# Patient Record
Sex: Male | Born: 1970 | Race: White | Hispanic: No | Marital: Single | Smoking: Never smoker
Health system: Southern US, Community
[De-identification: ages and names within clinical notes are randomized; demographics above are authoritative.]

---

## 2007-05-16 ENCOUNTER — Ambulatory Visit: Payer: Self-pay | Admitting: Family Medicine

## 2007-07-13 ENCOUNTER — Ambulatory Visit: Payer: Self-pay | Admitting: Family Medicine

## 2008-12-07 ENCOUNTER — Ambulatory Visit: Payer: Self-pay | Admitting: Surgery

## 2009-02-14 ENCOUNTER — Ambulatory Visit: Payer: Self-pay | Admitting: Surgery

## 2009-07-27 ENCOUNTER — Ambulatory Visit: Payer: Self-pay | Admitting: Internal Medicine

## 2010-06-05 ENCOUNTER — Ambulatory Visit: Payer: Self-pay | Admitting: Internal Medicine

## 2011-01-30 ENCOUNTER — Ambulatory Visit: Payer: Self-pay

## 2011-03-12 IMAGING — US US PELVIS LIMITED
1 series · 17 of 25 positions shown · non-contrast
Comparison: none

REASON FOR EXAM: RT inguinal hernia repair 09/06/08   RT testical swelling
eval hydrocele
COMMENTS:

[Series 1: us pelvis limited · 17 of 39 slices shown]
[im 1/39]
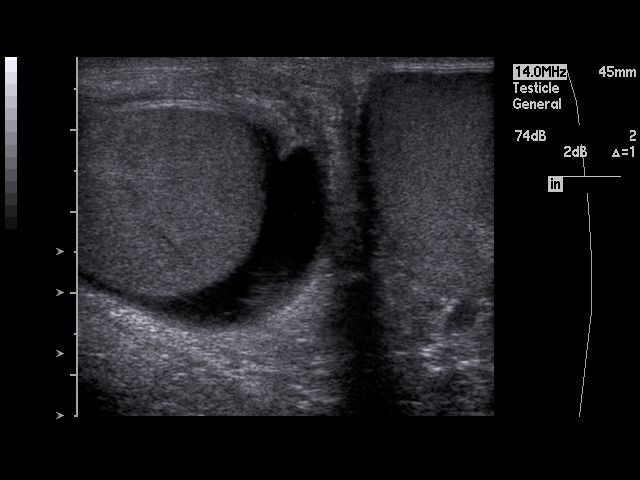
[im 4/39]
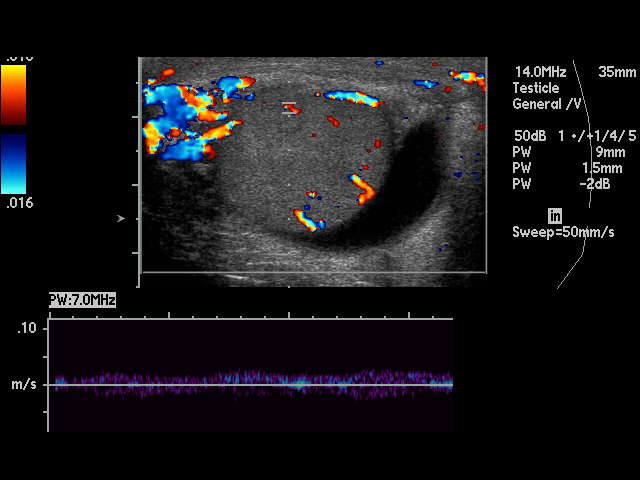
[im 5/39]
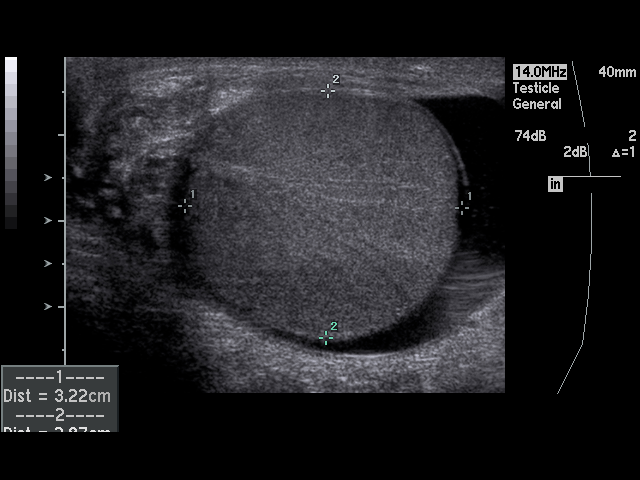
[im 8/39]
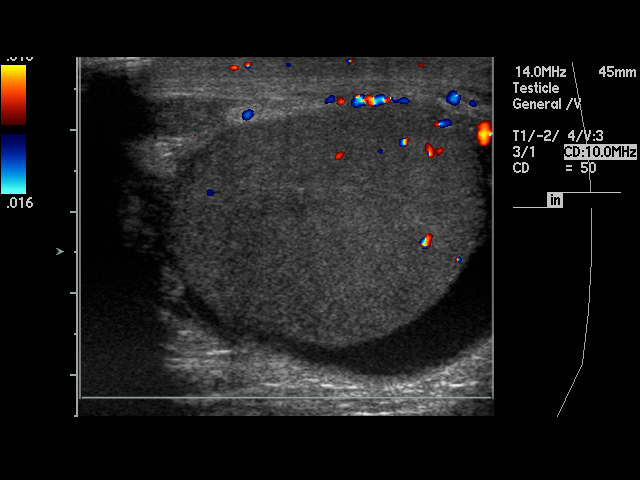
[im 10/39]
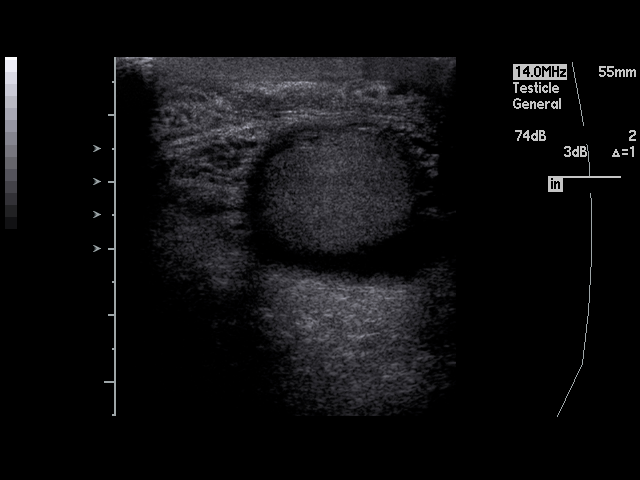
[im 13/39]
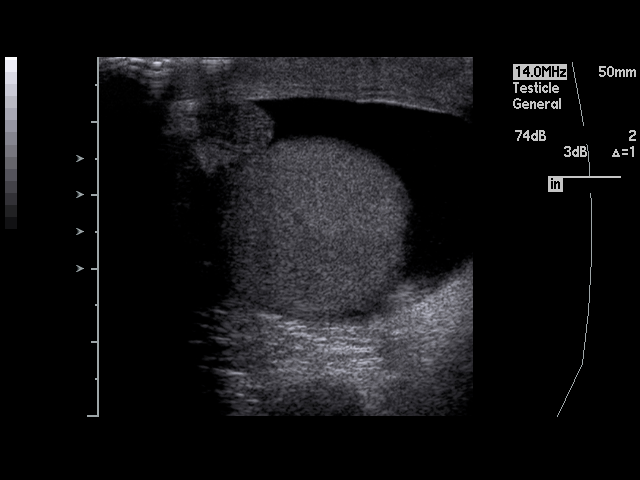
[im 15/39]
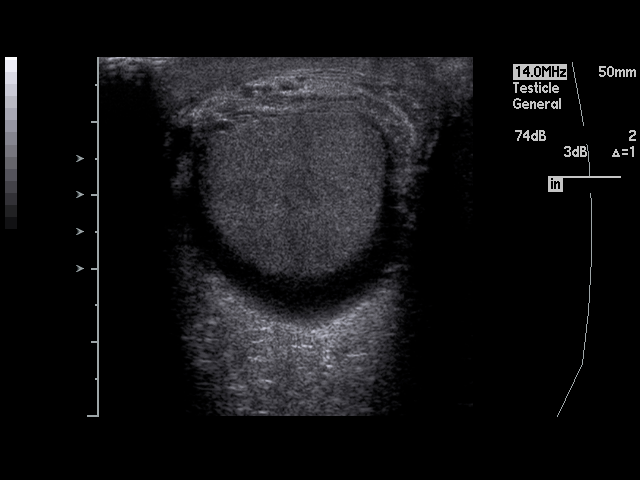
[im 18/39]
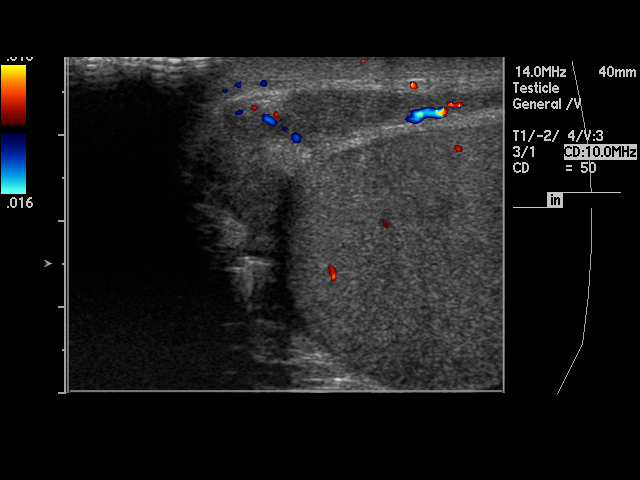
[im 20/39]
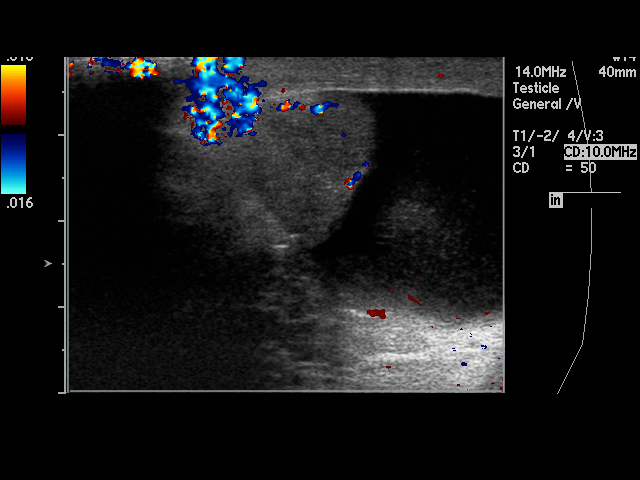
[im 21/39]
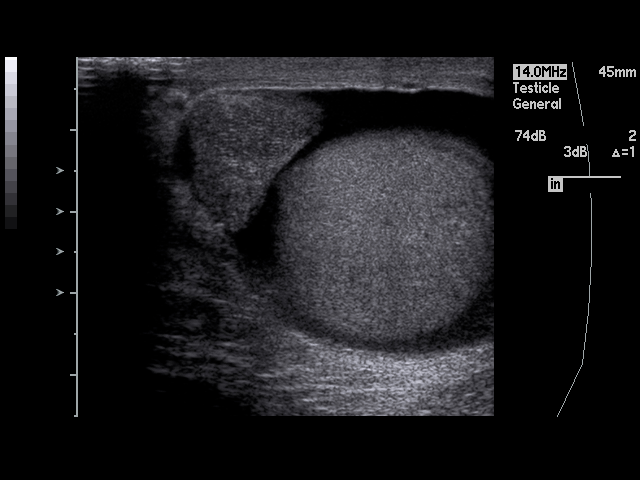
[im 24/39]
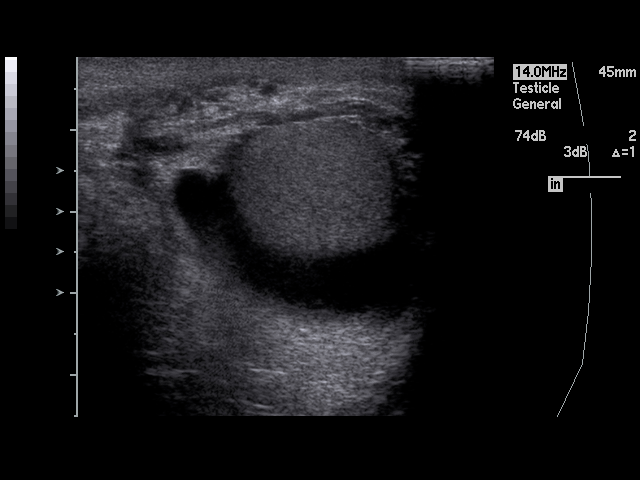
[im 26/39]
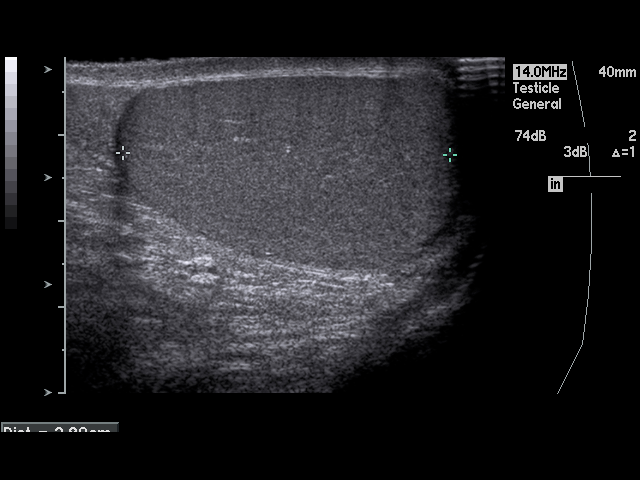
[im 29/39]
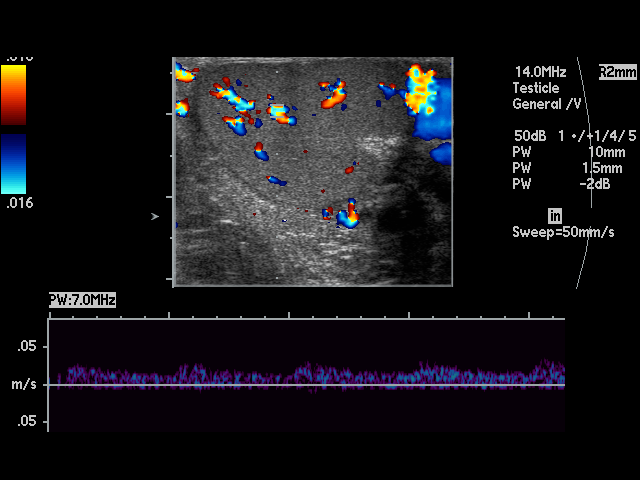
[im 31/39]
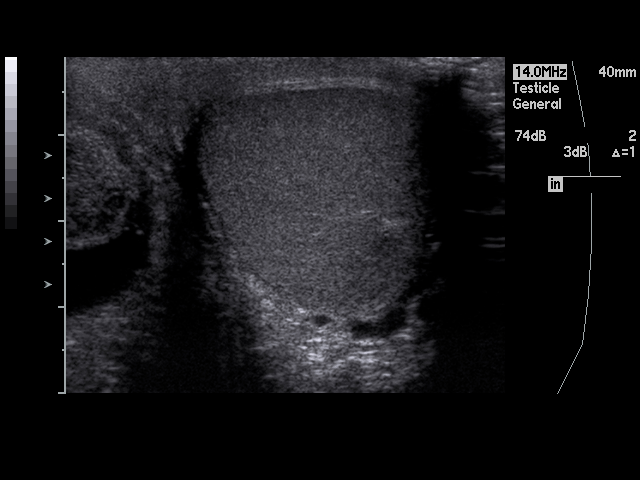
[im 34/39]
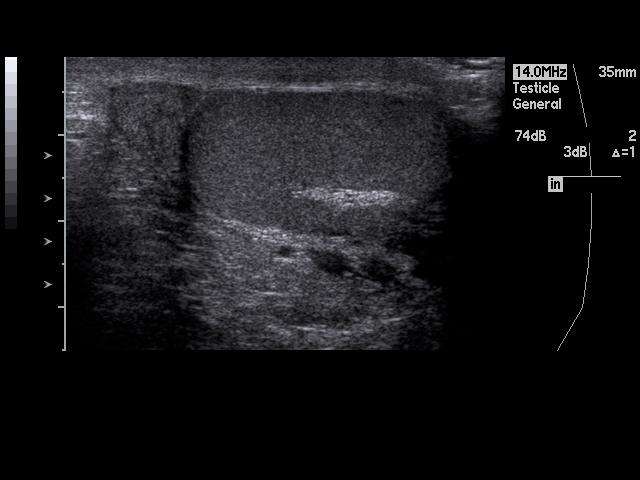
[im 35/39]
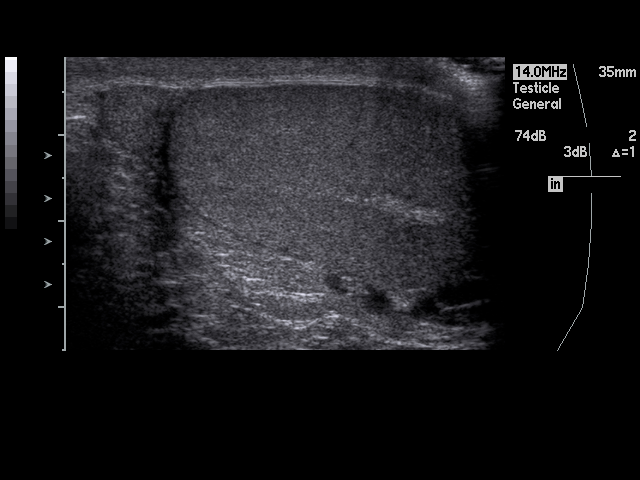
[im 39/39]
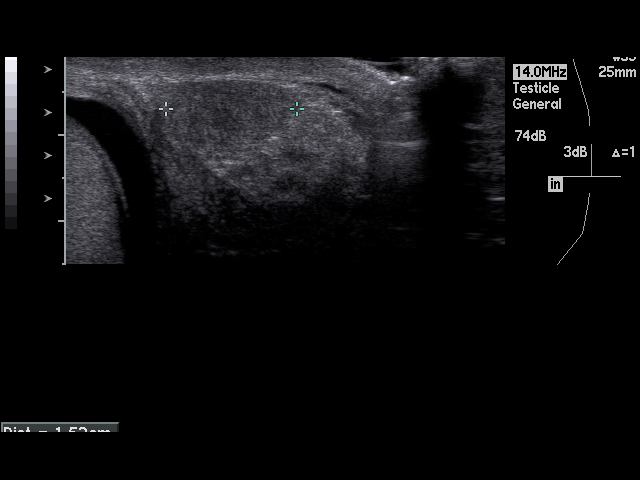

[17 of 25 positions shown; findings below may reference images not displayed]

PROCEDURE:     [facility] - [facility] TESTICULAR  - December 07, 2008 [DATE]

RESULT:     The testes demonstrate normal echotexture bilaterally. The right
testicle measures 3.7 x 3.2 x 2.9 cm. The left testicle measures 3.8 x 2.4 x
2.8 cm. Vascularity of the testes is normal. There is a hydrocele on the
right. The epididymal structures are relatively normal in appearance
although somewhat prominent in size. The right epididymal structure measures
maximum 3.9 cm and on the left it measures 3 cm in greatest dimension.
Vascularity appears normal.
IMPRESSION: 1. I do not see evidence of an intratesticular mass nor evidence of torsion
of the testicle.
2. There is mild prominence of the epididymal structures bilaterally without
objective evidence of acute epididymitis.
3. There is a hydrocele on the right.

## 2015-01-21 ENCOUNTER — Encounter: Payer: Self-pay | Admitting: Emergency Medicine

## 2015-01-21 ENCOUNTER — Ambulatory Visit
Admission: EM | Admit: 2015-01-21 | Discharge: 2015-01-21 | Disposition: A | Discharge status: Home / Self Care | Payer: Federal, State, Local not specified - PPO | Attending: Family Medicine | Admitting: Family Medicine

## 2015-01-21 DIAGNOSIS — J309 Allergic rhinitis, unspecified: Secondary | ICD-10-CM

## 2015-01-21 DIAGNOSIS — R030 Elevated blood-pressure reading, without diagnosis of hypertension: Secondary | ICD-10-CM

## 2015-01-21 DIAGNOSIS — H539 Unspecified visual disturbance: Secondary | ICD-10-CM

## 2015-01-21 DIAGNOSIS — H9313 Tinnitus, bilateral: Secondary | ICD-10-CM

## 2015-01-21 DIAGNOSIS — B001 Herpesviral vesicular dermatitis: Secondary | ICD-10-CM

## 2015-01-21 DIAGNOSIS — J0100 Acute maxillary sinusitis, unspecified: Secondary | ICD-10-CM | POA: Diagnosis not present

## 2015-01-21 DIAGNOSIS — H6593 Unspecified nonsuppurative otitis media, bilateral: Secondary | ICD-10-CM

## 2015-01-21 DIAGNOSIS — IMO0001 Reserved for inherently not codable concepts without codable children: Secondary | ICD-10-CM

## 2015-01-21 MED ORDER — MOMETASONE FUROATE 50 MCG/ACT NA SUSP: 2.0000 | Freq: Every day | NASAL | Status: AC

## 2015-01-21 MED ORDER — CETIRIZINE HCL 10 MG PO TBDP: 10.0000 mg | ORAL_TABLET | Freq: Every day | ORAL | Status: AC

## 2015-01-21 MED ORDER — SALINE SPRAY 0.65 % NA SOLN: 2.0000 | NASAL | Status: AC

## 2015-01-21 NOTE — Discharge Instructions (Signed)
Allergic Rhinitis °Allergic rhinitis is when the mucous membranes in the nose respond to allergens. Allergens are particles in the air that cause your body to have an allergic reaction. This causes you to release allergic antibodies. Through a chain of events, these eventually cause you to release histamine into the blood stream. Although meant to protect the body, it is this release of histamine that causes your discomfort, such as frequent sneezing, congestion, and an itchy, runny nose.  °CAUSES °Seasonal allergic rhinitis (hay fever) is caused by pollen allergens that may come from grasses, trees, and weeds. Year-round allergic rhinitis (perennial allergic rhinitis) is caused by allergens such as house dust mites, pet dander, and mold spores. °SYMPTOMS °· Nasal stuffiness (congestion). °· Itchy, runny nose with sneezing and tearing of the eyes. °DIAGNOSIS °Your health care provider can help you determine the allergen or allergens that trigger your symptoms. If you and your health care provider are unable to determine the allergen, skin or blood testing may be used. Your health care provider will diagnose your condition after taking your health history and performing a physical exam. Your health care provider may assess you for other related conditions, such as asthma, pink eye, or an ear infection. °TREATMENT °Allergic rhinitis does not have a cure, but it can be controlled by: °· Medicines that block allergy symptoms. These may include allergy shots, nasal sprays, and oral antihistamines. °· Avoiding the allergen. °Hay fever may often be treated with antihistamines in pill or nasal spray forms. Antihistamines block the effects of histamine. There are over-the-counter medicines that may help with nasal congestion and swelling around the eyes. Check with your health care provider before taking or giving this medicine. °If avoiding the allergen or the medicine prescribed do not work, there are many new medicines  your health care provider can prescribe. Stronger medicine may be used if initial measures are ineffective. Desensitizing injections can be used if medicine and avoidance does not work. Desensitization is when a patient is given ongoing shots until the body becomes less sensitive to the allergen. Make sure you follow up with your health care provider if problems continue. °HOME CARE INSTRUCTIONS °It is not possible to completely avoid allergens, but you can reduce your symptoms by taking steps to limit your exposure to them. It helps to know exactly what you are allergic to so that you can avoid your specific triggers. °SEEK MEDICAL CARE IF: °· You have a fever. °· You develop a cough that does not stop easily (persistent). °· You have shortness of breath. °· You start wheezing. °· Symptoms interfere with normal daily activities. °  °This information is not intended to replace advice given to you by your health care provider. Make sure you discuss any questions you have with your health care provider. °  °Document Released: 12/26/2000 Document Revised: 04/23/2014 Document Reviewed: 12/08/2012 °[employer] Interactive Patient Education ©2016 [employer] °Otitis Media With Effusion °Otitis media with effusion is the presence of fluid in the middle ear. This is a common problem in children, which often follows ear infections. It may be present for weeks or longer after the infection. Unlike an acute ear infection, otitis media with effusion refers only to fluid behind the ear drum and not infection. Children with repeated ear and sinus infections and allergy problems are the most likely to get otitis media with effusion. °CAUSES  °The most frequent cause of the fluid buildup is dysfunction of the eustachian tubes. These are the tubes that drain fluid   in the ears to the back of the nose (nasopharynx). °SYMPTOMS  °· The main symptom of this condition is hearing loss. As a result, you or your child may: °¨ Listen to the TV  at a loud volume. °¨ Not respond to questions. °¨ Ask "what" often when spoken to. °¨ Mistake or confuse one sound or word for another. °· There may be a sensation of fullness or pressure but usually not pain. °DIAGNOSIS  °· Your health care provider will diagnose this condition by examining you or your child's ears. °· Your health care provider may test the pressure in you or your child's ear with a tympanometer. °· A hearing test may be conducted if the problem persists. °TREATMENT  °· Treatment depends on the duration and the effects of the effusion. °· Antibiotics, decongestants, nose drops, and cortisone-type drugs (tablets or nasal spray) may not be helpful. °· Children with persistent ear effusions may have delayed language or behavioral problems. Children at risk for developmental delays in hearing, learning, and speech may require referral to a specialist earlier than children not at risk. °· You or your child's health care provider may suggest a referral to an ear, nose, and throat surgeon for treatment. The following may help restore normal hearing: °¨ Drainage of fluid. °¨ Placement of ear tubes (tympanostomy tubes). °¨ Removal of adenoids (adenoidectomy). °HOME CARE INSTRUCTIONS  °· Avoid secondhand smoke. °· Infants who are breastfed are less likely to have this condition. °· Avoid feeding infants while they are lying flat. °· Avoid known environmental allergens. °· Avoid people who are sick. °SEEK MEDICAL CARE IF:  °· Hearing is not better in 3 months. °· Hearing is worse. °· Ear pain. °· Drainage from the ear. °· Dizziness. °MAKE SURE YOU:  °· Understand these instructions. °· Will watch your condition. °· Will get help right away if you are not doing well or get worse. °  °This information is not intended to replace advice given to you by your health care provider. Make sure you discuss any questions you have with your health care provider. °  °Document Released: 05/10/2004 Document Revised:  04/23/2014 Document Reviewed: 10/28/2012 °[employer] Interactive Patient Education ©2016 [employer] °Sinusitis, Adult °Sinusitis is redness, soreness, and inflammation of the paranasal sinuses. Paranasal sinuses are air pockets within the bones of your face. They are located beneath your eyes, in the middle of your forehead, and above your eyes. In healthy paranasal sinuses, mucus is able to drain out, and air is able to circulate through them by way of your nose. However, when your paranasal sinuses are inflamed, mucus and air can become trapped. This can allow bacteria and other germs to grow and cause infection. °Sinusitis can develop quickly and last only a short time (acute) or continue over a long period (chronic). Sinusitis that lasts for more than 12 weeks is considered chronic. °CAUSES °Causes of sinusitis include: °· Allergies. °· Structural abnormalities, such as displacement of the cartilage that separates your nostrils (deviated septum), which can decrease the air flow through your nose and sinuses and affect sinus drainage. °· Functional abnormalities, such as when the small hairs (cilia) that line your sinuses and help remove mucus do not work properly or are not present. °SIGNS AND SYMPTOMS °Symptoms of acute and chronic sinusitis are the same. The primary symptoms are pain and pressure around the affected sinuses. Other symptoms include: °· Upper toothache. °· Earache. °· Headache. °· Bad breath. °· Decreased sense of smell and taste. °·   A cough, which worsens when you are lying flat. °· Fatigue. °· Fever. °· Thick drainage from your nose, which often is green and may contain pus (purulent). °· Swelling and warmth over the affected sinuses. °DIAGNOSIS °Your health care provider will perform a physical exam. During your exam, your health care provider may perform any of the following to help determine if you have acute sinusitis or chronic sinusitis: °· Look in your nose for signs of abnormal  growths in your nostrils (nasal polyps). °· Tap over the affected sinus to check for signs of infection. °· View the inside of your sinuses using an imaging device that has a light attached (endoscope). °If your health care provider suspects that you have chronic sinusitis, one or more of the following tests may be recommended: °· Allergy tests. °· Nasal culture. A sample of mucus is taken from your nose, sent to a lab, and screened for bacteria. °· Nasal cytology. A sample of mucus is taken from your nose and examined by your health care provider to determine if your sinusitis is related to an allergy. °TREATMENT °Most cases of acute sinusitis are related to a viral infection and will resolve on their own within 10 days. Sometimes, medicines are prescribed to help relieve symptoms of both acute and chronic sinusitis. These may include pain medicines, decongestants, nasal steroid sprays, or saline sprays. °However, for sinusitis related to a bacterial infection, your health care provider will prescribe antibiotic medicines. These are medicines that will help kill the bacteria causing the infection. °Rarely, sinusitis is caused by a fungal infection. In these cases, your health care provider will prescribe antifungal medicine. °For some cases of chronic sinusitis, surgery is needed. Generally, these are cases in which sinusitis recurs more than 3 times per year, despite other treatments. °HOME CARE INSTRUCTIONS °· Drink plenty of water. Water helps thin the mucus so your sinuses can drain more easily. °· Use a humidifier. °· Inhale steam 3-4 times a day (for example, sit in the bathroom with the shower running). °· Apply a warm, moist washcloth to your face 3-4 times a day, or as directed by your health care provider. °· Use saline nasal sprays to help moisten and clean your sinuses. °· Take medicines only as directed by your health care provider. °· If you were prescribed either an antibiotic or antifungal medicine,  finish it all even if you start to feel better. °SEEK IMMEDIATE MEDICAL CARE IF: °· You have increasing pain or severe headaches. °· You have nausea, vomiting, or drowsiness. °· You have swelling around your face. °· You have vision problems. °· You have a stiff neck. °· You have difficulty breathing. °  °This information is not intended to replace advice given to you by your health care provider. Make sure you discuss any questions you have with your health care provider. °  °Document Released: 04/02/2005 Document Revised: 04/23/2014 Document Reviewed: 04/17/2011 °[employer] Interactive Patient Education ©2016 [employer] ° °

## 2015-01-21 NOTE — ED Provider Notes (Signed)
CSN: [id number]     Arrival date & time 01/21/15  1251 History   First MD Initiated Contact with Patient 01/21/15 1419     Chief Complaint  Patient presents with  . Tinnitus   (Consider location/radiation/quality/duration/timing/severity/associated sxs/prior Treatment) HPI Comments: Married caucasian male here for evaluation of ringing in ears and sounds/voice sounding different.  [place]. [place] exposed to dust and mold frequently.  Seasonal allergies usual.  Some pressure sinuses and nasal/sinus congestion. Has noticed some tunnel vision lasting 5 seconds or less unsure if with or without position changes once every couple of days.   Denied dizziness, fever, chills, nausea, vomiting, headache, chest pain, dyspnea, dysuria, myalgias/arthralgias, cough.  Has tried mucinex without any relief  PMHx:  Seasonal allergic rhinitis, cold sores (current break out)  PSHx:  Right inguinal hernia repair  The history is provided by the patient.    History reviewed. No pertinent past medical history. History reviewed. No pertinent past surgical history. History reviewed. No pertinent family history. Social History  Substance Use Topics  . Smoking status: Never Smoker   . Smokeless tobacco: None  . Alcohol Use: Yes    Review of Systems  Constitutional: Negative for fever, chills, diaphoresis, activity change, appetite change, fatigue and unexpected weight change.  HENT: Positive for congestion, sinus pressure and tinnitus. Negative for dental problem, drooling, ear discharge, ear pain, facial swelling, hearing loss, mouth sores, nosebleeds, postnasal drip, rhinorrhea, sneezing, sore throat, trouble swallowing and voice change.   Eyes: Negative for photophobia, pain, discharge, redness, itching and visual disturbance.  Respiratory: Negative for cough, choking, chest tightness, shortness of breath, wheezing and stridor.   Cardiovascular: Negative for chest pain,  palpitations and leg swelling.  Gastrointestinal: Negative for nausea, vomiting, abdominal pain, diarrhea, constipation, blood in stool and abdominal distention.  Endocrine: Negative for cold intolerance and heat intolerance.  Genitourinary: Negative for dysuria.  Musculoskeletal: Negative for myalgias, back pain, joint swelling, arthralgias, gait problem, neck pain and neck stiffness.  Skin: Positive for rash. Negative for color change, pallor and wound.  Allergic/Immunologic: Positive for environmental allergies. Negative for food allergies.  Neurological: Negative for dizziness, tremors, seizures, syncope, facial asymmetry, speech difficulty, weakness, light-headedness, numbness and headaches.  Hematological: Negative for adenopathy. Does not bruise/bleed easily.  Psychiatric/Behavioral: Negative for behavioral problems, confusion, sleep disturbance and agitation.    Allergies  Review of patient's allergies indicates no known allergies.  Home Medications   Prior to Admission medications   Medication Sig Start Date End Date Taking? Authorizing Provider  Cetirizine HCl 10 MG TBDP Take 10 mg by mouth daily. 01/21/15   Barbaraann Barthel, NP  mometasone (NASONEX) 50 MCG/ACT nasal spray Place 2 sprays into the nose daily. 01/21/15   Barbaraann Barthel, NP  sodium chloride (OCEAN) 0.65 % SOLN nasal spray Place 2 sprays into both nostrils every 2 (two) hours while awake. 01/21/15   Barbaraann Barthel, NP   Meds Ordered and Administered this Visit  Medications - No data to display  BP 114/86 mmHg  Pulse 72  Temp(Src) 98.3 F (36.8 C) (Tympanic)  Resp 20  Ht 5\' 9"  (1.753 m)  Wt 168 lb (76.204 kg)  BMI 24.80 kg/m2  SpO2 98% No data found.   Physical Exam  Constitutional: He is oriented to person, place, and time. Vital signs are normal. He appears well-developed and well-nourished. No distress.  HENT:  Head: Normocephalic and atraumatic.  Right Ear: Hearing, external ear and ear canal  normal. A middle ear effusion is present.  Left Ear: Hearing, external ear and ear canal normal. A middle ear effusion is present.  Nose: Mucosal edema and rhinorrhea present. No nose lacerations, sinus tenderness, nasal deformity, septal deviation or nasal septal hematoma. No epistaxis.  No foreign bodies. Right sinus exhibits maxillary sinus tenderness. Right sinus exhibits no frontal sinus tenderness. Left sinus exhibits maxillary sinus tenderness. Left sinus exhibits no frontal sinus tenderness.  Mouth/Throat: Uvula is midline and mucous membranes are normal. Mucous membranes are not pale, not dry and not cyanotic. He does not have dentures. No oral lesions. No trismus in the jaw. Normal dentition. No dental abscesses, uvula swelling, lacerations or dental caries. Posterior oropharyngeal edema and posterior oropharyngeal erythema present. No oropharyngeal exudate or tonsillar abscesses.  Bilateral TMs with air fluid level clear; cobblestoning posterior pharynx; nasal turbinates with edema/erythema clear discharge bilaterally  Eyes: Conjunctivae, EOM and lids are normal. Pupils are equal, round, and reactive to light. Right eye exhibits no chemosis, no discharge, no exudate and no hordeolum. No foreign body present in the right eye. Left eye exhibits no chemosis, no discharge, no exudate and no hordeolum. No foreign body present in the left eye. Right conjunctiva is not injected. Right conjunctiva has no hemorrhage. Left conjunctiva is not injected. Left conjunctiva has no hemorrhage. No scleral icterus. Right eye exhibits normal extraocular motion and no nystagmus. Left eye exhibits normal extraocular motion and no nystagmus. Right pupil is round and reactive. Left pupil is round and reactive. Pupils are equal.  Neck: Trachea normal and normal range of motion. Neck supple. No tracheal tenderness, no spinous process tenderness and no muscular tenderness present. No rigidity. No tracheal deviation, no edema,  no erythema and normal range of motion present. No thyromegaly present.  Cardiovascular: Normal rate, regular rhythm, S1 normal, S2 normal, normal heart sounds and intact distal pulses.  PMI is not displaced.  Exam reveals no gallop and no friction rub.   No murmur heard. Pulmonary/Chest: Effort normal and breath sounds normal. No accessory muscle usage or stridor. No respiratory distress. He has no decreased breath sounds. He has no wheezes. He has no rhonchi. He has no rales. He exhibits no tenderness.  Abdominal: Soft. He exhibits no distension and no ascites.  Musculoskeletal: Normal range of motion. He exhibits no edema or tenderness.  Lymphadenopathy:       Head (right side): No submental, no submandibular, no tonsillar, no preauricular, no posterior auricular and no occipital adenopathy present.       Head (left side): No submental, no submandibular, no tonsillar, no preauricular, no posterior auricular and no occipital adenopathy present.    He has cervical adenopathy.       Right cervical: No superficial cervical, no deep cervical and no posterior cervical adenopathy present.      Left cervical: Posterior cervical adenopathy present. No superficial cervical and no deep cervical adenopathy present.  Fullness left posterior cervical chain no discrete nodules palpated; left vermillion lip border with crusted lesion less than 1 cm  Neurological: He is alert and oriented to person, place, and time. No cranial nerve deficit. He exhibits normal muscle tone. Coordination normal.  Skin: Skin is warm, dry and intact. Rash noted. No abrasion, no bruising, no burn, no ecchymosis, no laceration, no lesion, no petechiae and no purpura noted. Rash is maculopapular. Rash is not macular, not papular, not nodular, not pustular, not vesicular and not urticarial. He is not diaphoretic. No cyanosis or erythema. No pallor.  Nails show no clubbing.     Psychiatric: He has a normal mood and affect. His speech is  normal and behavior is normal. Judgment and thought content normal. Cognition and memory are normal.  Nursing note and vitals reviewed.   ED Course  Procedures (including critical care time)  Labs Review Labs Reviewed - No data to display  Imaging Review No results found.    MDM   1. Acute maxillary sinusitis, recurrence not specified   2. Allergic rhinitis, unspecified allergic rhinitis type   3. Otitis media with effusion, bilateral   4. Elevated blood pressure   5. Tinnitus of both ears   6. Transient visual disturbance, bilateral   7. Recurrent cold sores    Start nasonex 2 sprays each nostril daily.  If no improvement with nasonex and nasal saline worsening sinus pressure, fever, purulent nasal drainage will give Rx for augmentin 875mg  po BID x 10 days.  No evidence of systemic bacterial infection, non toxic and well hydrated.  I do not see where any further testing or imaging is necessary at this time.   I will suggest supportive care, rest, good hygiene and encourage the patient to take adequate fluids.  The patient is to return to clinic or EMERGENCY ROOM if symptoms worsen or change significantly.  Exitcare handout on sinusitis given to patient.  Patient verbalized agreement and understanding of treatment plan and had no further questions at this time.   P2:  Hand washing and cover cough  Patient may use normal saline nasal spray as needed.  Restart zyrtec 10mg  po daily and if still having postnasal drip/congestion start nasonex 2 sprays each nostril daily.  Consider antihistamine or nasal steroid use.  Avoid triggers if possible.  Shower prior to bedtime if exposed to triggers.  If allergic dust/dust mites recommend mattress/pillow covers/encasements; washing linens, vacuuming, sweeping, dusting weekly.  Call or return to clinic as needed if these symptoms worsen or fail to improve as anticipated.   Exitcare handout on allergic rhinitis given to patient.  Patient verbalized  understanding of instructions, agreed with plan of care and had no further questions at this time.  P2:  Avoidance and hand washing.  Discussed tinnitus related to fluid in middle ear.  May take up to 30 days to completely resolve.  Supportive treatment.   No evidence of invasive bacterial infection, non toxic and well hydrated.  This is most likely self limiting viral infection.  I do not see where any further testing or imaging is necessary at this time.   I will suggest supportive care, rest, good hygiene and encourage the patient to take adequate fluids.  The patient is to return to clinic or EMERGENCY ROOM if symptoms worsen or change significantly e.g. ear pain, fever, purulent discharge from ears or bleeding.  Exitcare handout on otitis media with effusion given to patient.  Patient verbalized agreement and understanding of treatment plan.   Hydrate.  Consider to monitor blood pressure at home and maintain log of blood pressure and pulse to bring to follow up appointments with PCM.  Discussed hypertension blood pressure reading greater than 140 systolically or 90 diastolically.  Currently prehypertension.  Consider low sodium diet and exercise program.  Recommended weight loss/weight maintenance to BMI 20-25.  Return to the clinic if any new symptoms.  Follow up with [facility] for repeat blood pressure as last PCM visit 114/78 per review care everywhere Epic. Has not had routine optometry exam in many years discussed with patient  to schedule routine appt as symptoms could be cardiac, vascular, eye pressure (glaucoma).  ER if visual field loss lasting longer than a minute.  Follow up for re-evaluation if dyspnea, chest pain, headache, worsening visual disturbances, dizziness, nausea or vomiting.  Patient verbalized agreement and understanding of treatment plan and had no further questions at this time.   P2:  Diet and Exercise specific for HTN  Posterior cervical lymphadenopathy probably related to cold  sore.  If no resolution with resolution of cold sore follow up with [facility] for re-evaluation.  Discussed with patient to not share eating utensils, oral hygiene products.  Wash hands frequently.  Wash dishes in hot water or dishwasher if available.  Do not touch sores.  Recommended no oral sex or kissing until lesions resolved completely.  Discussed with patient viral shedding occurs even after lesion healed and is possible to transmit infection to close oral contact partners.  Patient opted to initiate antiviral treatment today.  Exitcare handout on herpes simplex I lesions given to patient.  Patient verbalized understanding of information, agreed with plan of care and had no further questions at this time.   P2 handwashing, avoid oral contact with others, do not share eating or oral hygiene utensils.  Barbaraann Barthel, NP 01/21/15 [phone]

## 2015-01-21 NOTE — ED Notes (Signed)
Pt with ringing in both ears x 1 day
